# Patient Record
Sex: Male | Born: 1985 | Race: Black or African American | Hispanic: No | Marital: Single | Smoking: Current every day smoker
Health system: Southern US, Community
[De-identification: ages and names within clinical notes are randomized; demographics above are authoritative.]

## PROBLEM LIST (undated history)

## (undated) DIAGNOSIS — G4700 Insomnia, unspecified: Secondary | ICD-10-CM

## (undated) DIAGNOSIS — N289 Disorder of kidney and ureter, unspecified: Secondary | ICD-10-CM

## (undated) DIAGNOSIS — F419 Anxiety disorder, unspecified: Secondary | ICD-10-CM

## (undated) DIAGNOSIS — I10 Essential (primary) hypertension: Secondary | ICD-10-CM

---

## 2019-07-10 ENCOUNTER — Emergency Department (HOSPITAL_COMMUNITY)
Admission: EM | Admit: 2019-07-10 | Discharge: 2019-07-10 | Disposition: A | Discharge status: Home / Self Care | Payer: Self-pay | Attending: Emergency Medicine | Admitting: Emergency Medicine

## 2019-07-10 ENCOUNTER — Encounter (HOSPITAL_COMMUNITY): Payer: Self-pay | Admitting: Emergency Medicine

## 2019-07-10 ENCOUNTER — Other Ambulatory Visit: Payer: Self-pay

## 2019-07-10 DIAGNOSIS — N186 End stage renal disease: Secondary | ICD-10-CM | POA: Insufficient documentation

## 2019-07-10 DIAGNOSIS — Z5321 Procedure and treatment not carried out due to patient leaving prior to being seen by health care provider: Secondary | ICD-10-CM | POA: Insufficient documentation

## 2019-07-10 DIAGNOSIS — Z992 Dependence on renal dialysis: Secondary | ICD-10-CM | POA: Insufficient documentation

## 2019-07-10 DIAGNOSIS — Z4931 Encounter for adequacy testing for hemodialysis: Secondary | ICD-10-CM | POA: Insufficient documentation

## 2019-07-10 HISTORY — DX: Essential (primary) hypertension: I10

## 2019-07-10 HISTORY — DX: Anxiety disorder, unspecified: F41.9

## 2019-07-10 HISTORY — DX: Insomnia, unspecified: G47.00

## 2019-07-10 HISTORY — DX: Disorder of kidney and ureter, unspecified: N28.9

## 2019-07-10 NOTE — ED Triage Notes (Signed)
Pt reports he recently moved here from [place] needs to be established at dialysis center here,  Typical dialysis TTS, last dialysis last Thursday. Pt denies pain, N/V or any other complaints.

## 2019-07-10 NOTE — ED Notes (Signed)
Pt reports he is going to try and call local dialysis centers to try and get dialysis treatment. Pt encouraged to stay and wait to see provider. Pt reports he is leaving.

## 2019-07-12 ENCOUNTER — Emergency Department (HOSPITAL_COMMUNITY): Payer: Self-pay

## 2019-07-12 ENCOUNTER — Emergency Department (HOSPITAL_COMMUNITY)
Admission: EM | Admit: 2019-07-12 | Discharge: 2019-07-12 | Disposition: A | Discharge status: Home / Self Care | Payer: Self-pay | Attending: Emergency Medicine | Admitting: Emergency Medicine

## 2019-07-12 ENCOUNTER — Encounter (HOSPITAL_COMMUNITY): Payer: Self-pay | Admitting: Emergency Medicine

## 2019-07-12 ENCOUNTER — Telehealth: Payer: Self-pay | Admitting: *Deleted

## 2019-07-12 ENCOUNTER — Other Ambulatory Visit: Payer: Self-pay

## 2019-07-12 DIAGNOSIS — I120 Hypertensive chronic kidney disease with stage 5 chronic kidney disease or end stage renal disease: Secondary | ICD-10-CM | POA: Insufficient documentation

## 2019-07-12 DIAGNOSIS — N186 End stage renal disease: Secondary | ICD-10-CM | POA: Insufficient documentation

## 2019-07-12 DIAGNOSIS — Z992 Dependence on renal dialysis: Secondary | ICD-10-CM | POA: Insufficient documentation

## 2019-07-12 DIAGNOSIS — F17200 Nicotine dependence, unspecified, uncomplicated: Secondary | ICD-10-CM | POA: Insufficient documentation

## 2019-07-12 LAB — CBC
HCT: 39.2 % (ref 39.0–52.0)
Hemoglobin: 12.2 g/dL — ABNORMAL LOW (ref 13.0–17.0)
MCH: 26.5 pg (ref 26.0–34.0)
MCHC: 31.1 g/dL (ref 30.0–36.0)
MCV: 85.2 fL (ref 80.0–100.0)
Platelets: 349 10*3/uL (ref 150–400)
RBC: 4.6 MIL/uL (ref 4.22–5.81)
RDW: 17 % — ABNORMAL HIGH (ref 11.5–15.5)
WBC: 9.6 10*3/uL (ref 4.0–10.5)
nRBC: 0 % (ref 0.0–0.2)

## 2019-07-12 LAB — BASIC METABOLIC PANEL
Anion gap: 18 — ABNORMAL HIGH (ref 5–15)
BUN: 124 mg/dL — ABNORMAL HIGH (ref 6–20)
CO2: 20 mmol/L — ABNORMAL LOW (ref 22–32)
Calcium: 8.6 mg/dL — ABNORMAL LOW (ref 8.9–10.3)
Chloride: 101 mmol/L (ref 98–111)
Creatinine, Ser: 22.82 mg/dL — ABNORMAL HIGH (ref 0.61–1.24)
GFR calc Af Amer: 3 mL/min — ABNORMAL LOW (ref >60)
GFR calc non Af Amer: 2 mL/min — ABNORMAL LOW (ref >60)
Glucose, Bld: 147 mg/dL — ABNORMAL HIGH (ref 70–99)
Potassium: 4.5 mmol/L (ref 3.5–5.1)
Sodium: 139 mmol/L (ref 135–145)

## 2019-07-12 NOTE — ED Notes (Signed)
Patient verbalized understanding of discharge instructions. Opportunity for questions were provided. Pt. ambulatory and discharged home.  

## 2019-07-12 NOTE — ED Provider Notes (Addendum)
TIME SEEN: 5:58 AM  CHIEF COMPLAINT: Requesting dialysis  HPI: Patient is a 33 year old male with history of hypertension, end-stage renal disease on dialysis for the past 2 months previously living in [place] who presents to the emergency department requesting dialysis.  States he just moved here and did not have dialysis set up.  States in the past he has been told to "just go to the ER".  He still makes urine.  He states he was last dialyzed on Thursday, September 17.  Previously was getting dialysis every Tuesday, Thursday and Saturday.  He states he feels like his face is "more puffy" than normal but states that this is improved while being in the waiting room.  He states he feels short of breath but no chest pain.  No fevers, cough.  No lower extremity swelling.  He does not have a primary care physician or nephrologist in this area.  ROS: See HPI Constitutional: no fever  Eyes: no drainage  ENT: no runny nose   Cardiovascular:  no chest pain  Resp:  SOB  GI: no vomiting GU: no dysuria Integumentary: no rash  Allergy: no hives  Musculoskeletal: no leg swelling  Neurological: no slurred speech ROS otherwise negative  PAST MEDICAL HISTORY/PAST SURGICAL HISTORY:  Past Medical History:  Diagnosis Date  . Anxiety   . Hypertension   . Insomnia   . Renal disorder    chronic kidney disease    MEDICATIONS:  Prior to Admission medications   Not on File    ALLERGIES:  No Known Allergies  SOCIAL HISTORY:  Social History   Tobacco Use  . Smoking status: Current Every Day Smoker  . Smokeless tobacco: Never Used  Substance Use Topics  . Alcohol use: Not Currently    FAMILY HISTORY: No family history on file.  EXAM: BP (!) 143/80   Pulse 73   Temp 97.7 F (36.5 C) (Oral)   Resp (!) 21   SpO2 100%  CONSTITUTIONAL: Alert and oriented and responds appropriately to questions. Well-appearing; well-nourished HEAD: Normocephalic EYES: Conjunctivae clear,  pupils appear equal, EOMI ENT: normal nose; moist mucous membranes, no swelling of the face; no redness or warmth to the face, normal speech, no angioedema NECK: Supple, no meningismus, no nuchal rigidity, no LAD  CARD: RRR; S1 and S2 appreciated; no murmurs, no clicks, no rubs, no gallops CHEST: Catheter in the right chest wall without surrounding redness, warmth, bleeding or drainage RESP: Normal chest excursion without splinting or tachypnea; breath sounds clear and equal bilaterally; no wheezes, no rhonchi, no rales, no hypoxia or respiratory distress, speaking full sentences ABD/GI: Normal bowel sounds; non-distended; soft, non-tender, no rebound, no guarding, no peritoneal signs, no hepatosplenomegaly BACK:  The back appears normal and is non-tender to palpation, there is no CVA tenderness EXT: Normal ROM in all joints; non-tender to palpation; no edema; normal capillary refill; no cyanosis, no calf tenderness or swelling    SKIN: Normal color for age and race; warm; no rash NEURO: Moves all extremities equally PSYCH: The patient's mood and manner are appropriate. Grooming and personal hygiene are appropriate.  MEDICAL DECISION MAKING: Patient here requesting dialysis.  Discussed with patient that he has no signs that he needs emergent dialysis at this time.  He is normotensive currently.  He has no hypoxia or increased work of breathing.  His lungs are clear to auscultation and his chest x-ray shows no pulmonary edema.  His potassium is 4.5.  There is no indication that  he needs dialysis currently discussed with patient that we do not have the resources within our facility to perform dialysis unless it is for an urgent or emergent need at this time.  I have placed consultation for social work and case management to help set patient up for outpatient dialysis.  His contact number is [phone].  I have confirmed this with patient and will discharge him and discussed with him that case  management, social work will contact him by phone.  We did discuss at length return precautions.  He verbalized understanding.  At this time, I do not feel there is any life-threatening condition present. I have reviewed and discussed all results (EKG, imaging, lab, urine as appropriate) and exam findings with patient/family. I have reviewed nursing notes and appropriate previous records.  I feel the patient is safe to be discharged home without further emergent workup and can continue workup as an outpatient as needed. Discussed usual and customary return precautions. Patient/family verbalize understanding and are comfortable with this plan.  Outpatient follow-up has been provided as needed. All questions have been answered.      Lakaisha Danish, Delice Bison, DO 07/12/19 Bonnie, Delice Bison, DO 07/12/19 [phone]

## 2019-07-12 NOTE — ED Triage Notes (Signed)
Pt here requesting dialysis, reports he has recently moved here. Normally has dialysis on Tues/Thurs/Sat. C/o facial swelling and mild shortness of breath. Verbal orders given by Dr. Roxanne Mins

## 2019-07-12 NOTE — Telephone Encounter (Signed)
EDCM called pt to advise about contacting Nephrology MD for transfer of records to [facility] (previous office in [place], [place]).

## 2019-07-12 NOTE — Discharge Instructions (Signed)
At this time you do not meet criteria for urgent or emergent dialysis.  I have placed a consult for case management and social work to help you establish nephrology care and dialysis here in [place].  Steps to find a Primary Care Provider (PCP):  Call [phone] or [phone] to access "[place] [facility]."  2.  You may also go on the [facility] website at [email or web address]  3.  [facility] also frequently accepts new patients.  [place] [phone]  4.  There are also multiple [facility], [facility] and [facility] practices throughout the [place] that are frequently accepting new patients. You may find a clinic that is close to your home and contact them.  [email or web address] [phone]  [facility] [email or web address]  [email or web address] [place] [email or web address] [phone]  5.  Local Health Departments also can provide primary care services.  [facility]  [place] [address] [phone]  [facility] [place] [address] [place] [facility] [place] [phone]

## 2020-09-22 IMAGING — CR DG CHEST 2V
2 series · 2 of 2 positions shown · non-contrast
Comparison: None.

CLINICAL DATA: Shortness of breath.  Dialysis.

EXAM:
CHEST - 2 VIEW

[chest pa]
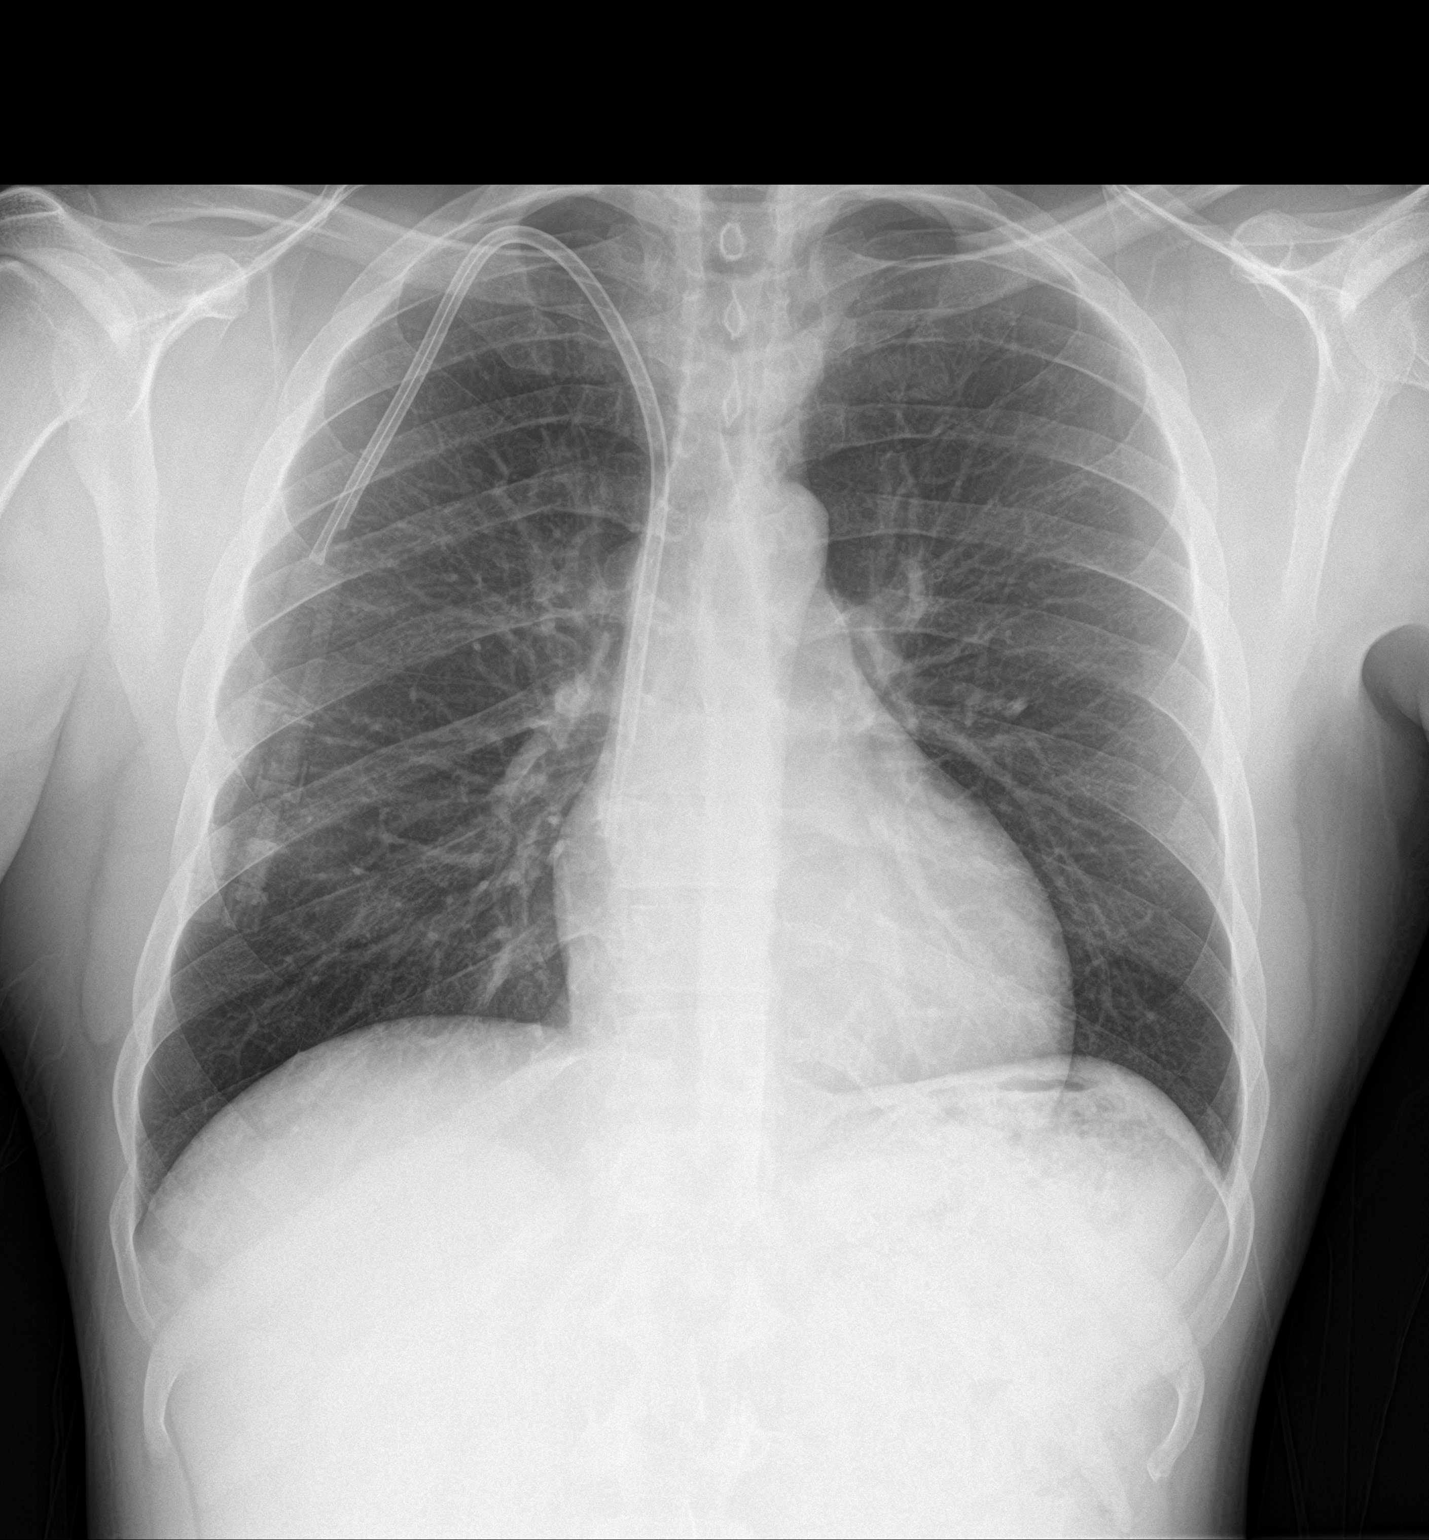

[chest lat]
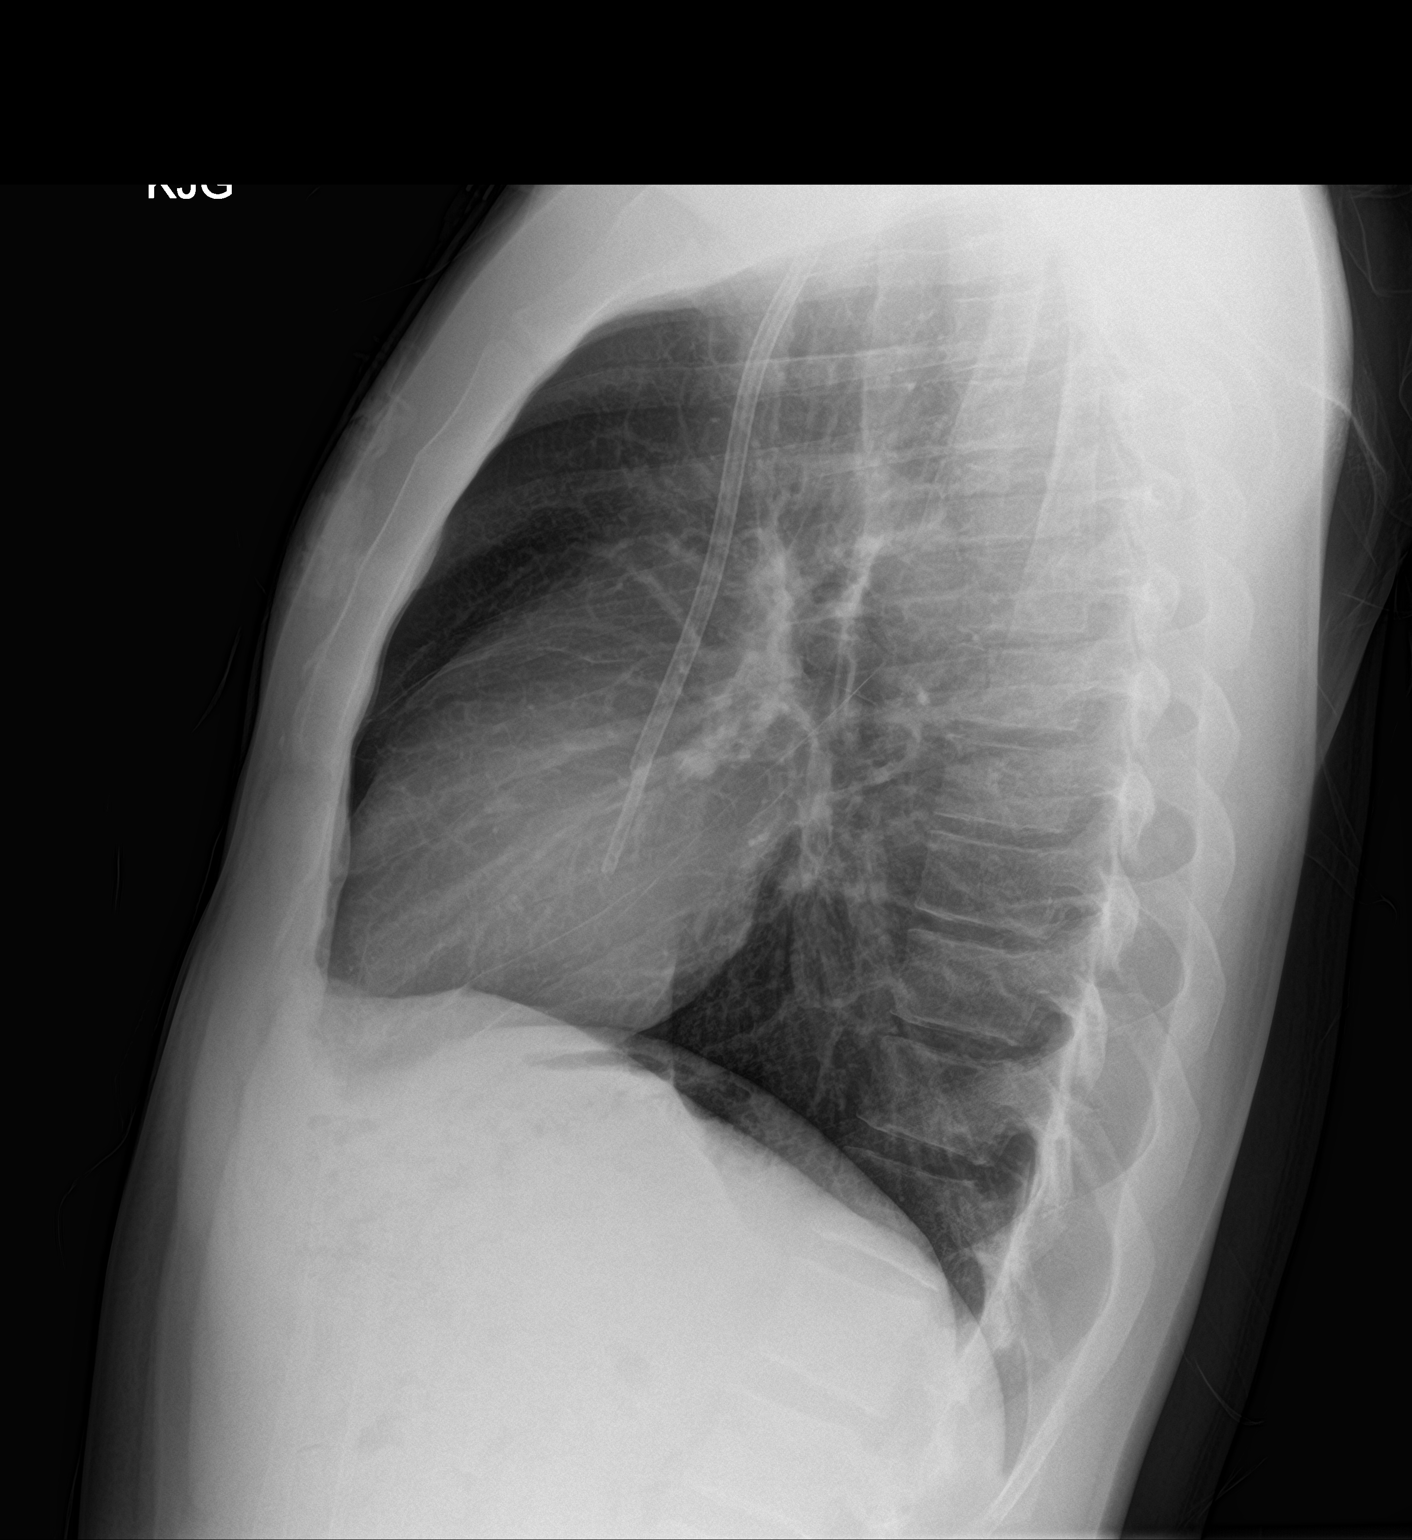

[2 of 2 positions shown; findings below may reference images not displayed]

FINDINGS: Normal heart size and mediastinal contours. Dialysis catheter with
tips at the upper cavoatrial junction. Distended stomach presumably
from recent meal. There is no edema, consolidation, effusion, or
pneumothorax.
IMPRESSION: No evidence of active disease, including pulmonary edema.

## 2022-07-19 DEATH — deceased
